# Patient Record
Sex: Female | Born: 1975
Health system: Southern US, Community
[De-identification: ages and names within clinical notes are randomized; demographics above are authoritative.]

## PROBLEM LIST (undated history)

## (undated) DIAGNOSIS — I10 Essential (primary) hypertension: Secondary | ICD-10-CM

## (undated) DIAGNOSIS — E785 Hyperlipidemia, unspecified: Secondary | ICD-10-CM

## (undated) DIAGNOSIS — C801 Malignant (primary) neoplasm, unspecified: Secondary | ICD-10-CM

## (undated) HISTORY — DX: Malignant (primary) neoplasm, unspecified: C80.1

## (undated) HISTORY — DX: Hyperlipidemia, unspecified: E78.5

## (undated) HISTORY — DX: Essential (primary) hypertension: I10

---

## 2002-05-06 ENCOUNTER — Other Ambulatory Visit: Admission: RE | Admit: 2002-05-06 | Discharge: 2002-05-06 | Payer: Self-pay | Admitting: Obstetrics and Gynecology

## 2003-06-02 ENCOUNTER — Other Ambulatory Visit: Admission: RE | Admit: 2003-06-02 | Discharge: 2003-06-02 | Payer: Self-pay | Admitting: Obstetrics and Gynecology

## 2004-07-25 ENCOUNTER — Other Ambulatory Visit: Admission: RE | Admit: 2004-07-25 | Discharge: 2004-07-25 | Payer: Self-pay | Admitting: Obstetrics and Gynecology

## 2004-08-17 ENCOUNTER — Ambulatory Visit (HOSPITAL_COMMUNITY): Admission: RE | Admit: 2004-08-17 | Discharge: 2004-08-17 | Payer: Self-pay | Admitting: Obstetrics and Gynecology

## 2008-09-23 ENCOUNTER — Ambulatory Visit (HOSPITAL_COMMUNITY): Admission: RE | Admit: 2008-09-23 | Discharge: 2008-09-23 | Payer: Self-pay | Admitting: Gynecology

## 2010-05-31 IMAGING — RF DG HYSTEROGRAM
11 series · 11 of 11 positions shown · non-contrast
Comparison: none

CLINICAL DATA: Infertility.  Evaluate tubal patency

HYSTEROSALPINGOGRAM
TECHNIQUE: Hysterosalpingogram was performed by the ordering
physician under fluoroscopy.  Fluoroscopic images are submitted for
interpretation following the procedure.

[Series 1: run · 1 of 1 slices shown (1 of 11)]
[im 1/1]
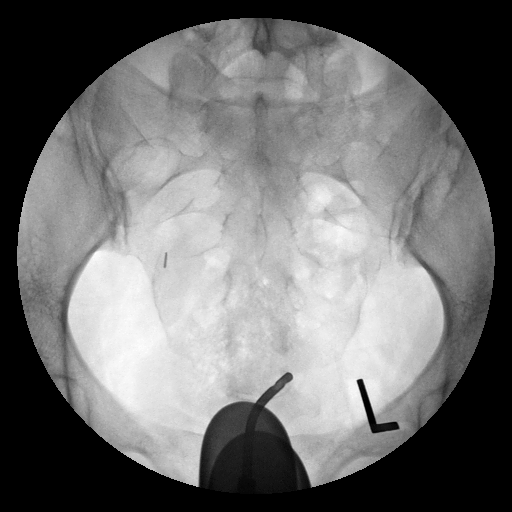

[Series 2: run · 1 of 1 slices shown (2 of 11)]
[im 1/1]
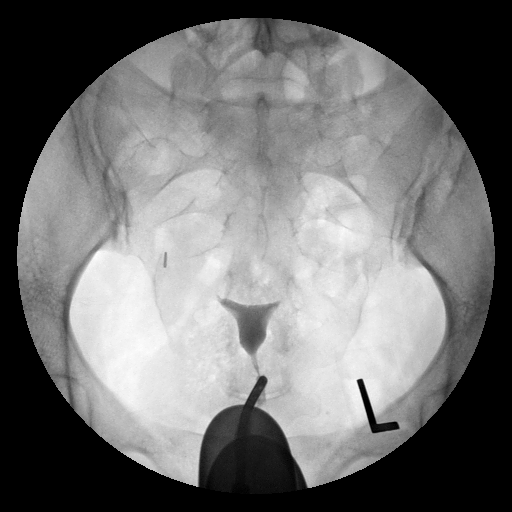

[Series 3: run · 1 of 1 slices shown (3 of 11)]
[im 1/1]
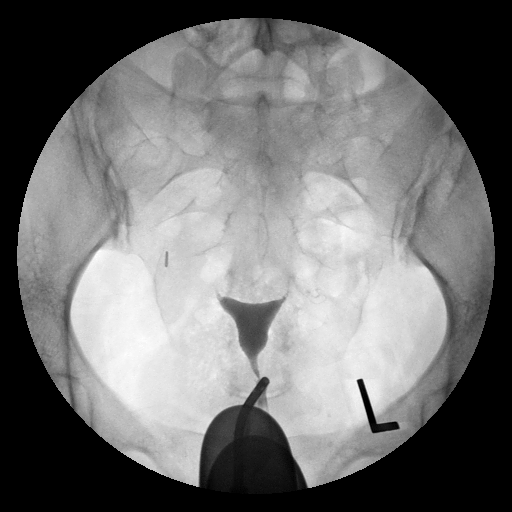

[Series 4: run · 1 of 1 slices shown (4 of 11)]
[im 1/1]
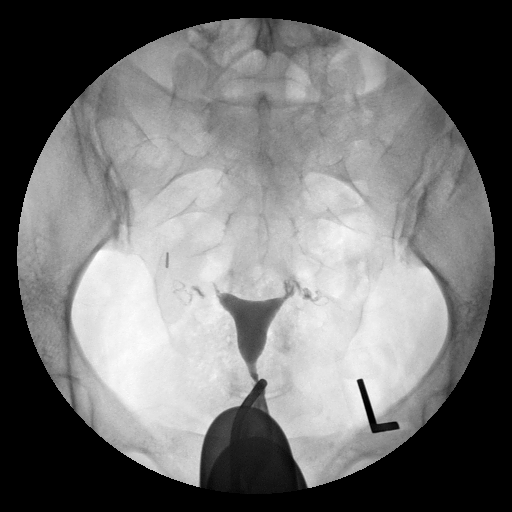

[Series 5: run · 1 of 1 slices shown (5 of 11)]
[im 1/1]
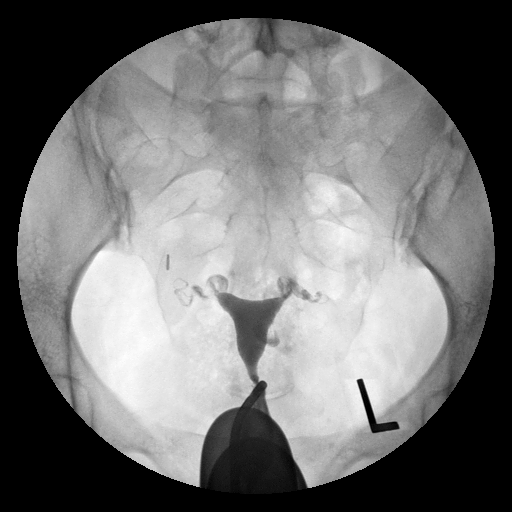

[Series 6: run · 1 of 1 slices shown (6 of 11)]
[im 1/1]
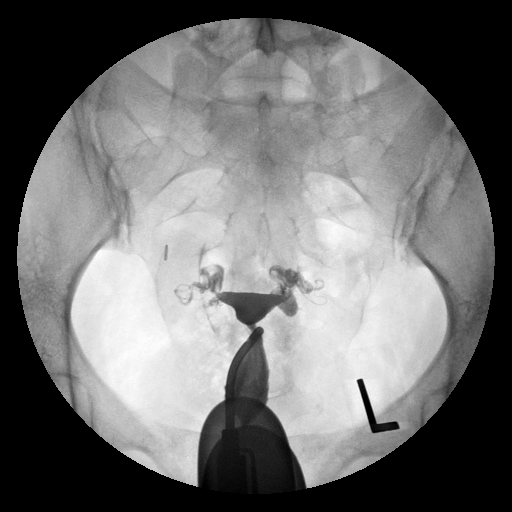

[Series 7: run · 1 of 1 slices shown (7 of 11)]
[im 1/1]
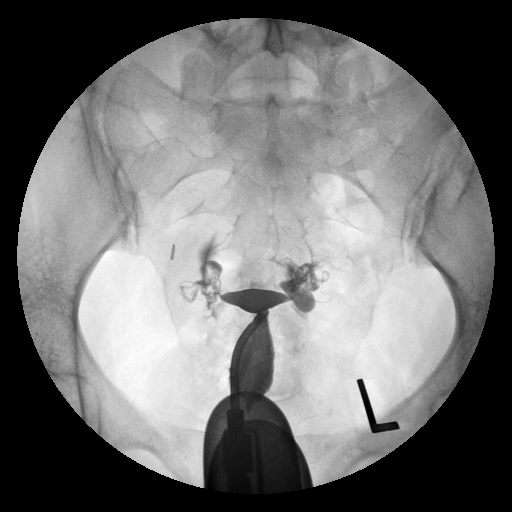

[Series 8: run · 1 of 1 slices shown (8 of 11)]
[im 1/1]
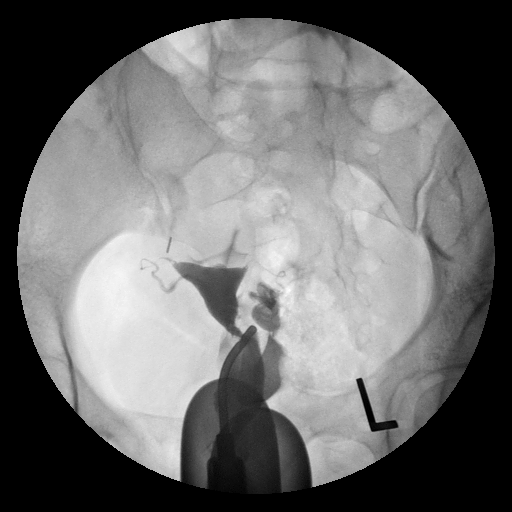

[Series 9: run · 1 of 1 slices shown (9 of 11)]
[im 1/1]
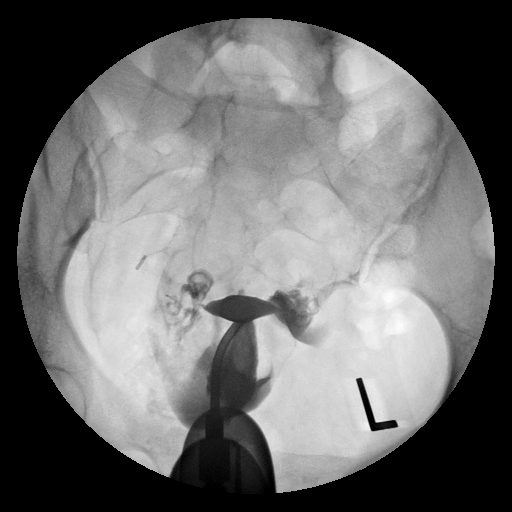

[Series 10: run · 1 of 1 slices shown (10 of 11)]
[im 1/1]
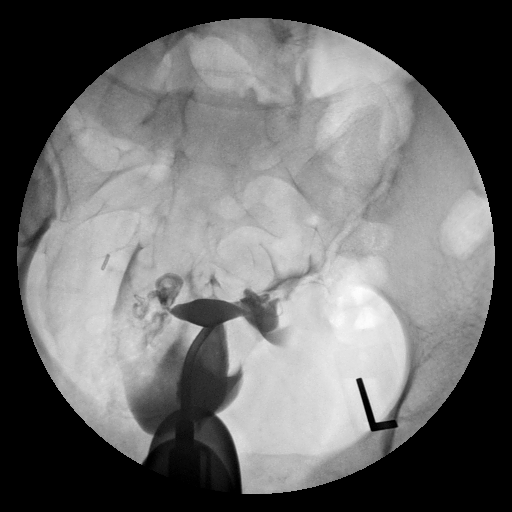

[Series 11: run · 1 of 1 slices shown (11 of 11)]
[im 1/1]
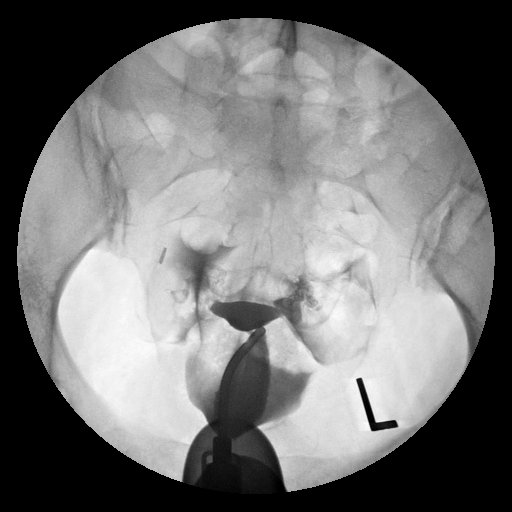

[11 of 11 positions shown; findings below may reference images not displayed]

FINDINGS: A normal endometrial morphology is seen.  The right
fallopian tube demonstrates a normal morphology and free
intraperitoneal spill is noted from this side.

The left fallopian tube is notable for a somewhat bulbous
appearance of the distal aspect of the ampullary portion of the
tube, however free intraperitoneal spill is noted on this side
making this finding of questionable significance.  This could be
the result of some incompletely obstructive adhesions in the
ampullary region.
IMPRESSION: Bilateral tubal patency confirmed with free spill noted.  Normal
endometrial morphology and right fallopian tube morphology.
Somewhat bulbous appearance of the of the distal aspect of the
ampullary portion of the left fallopian tube which is of
questionable significance.  If there was  delayed spill on this
side at real time exam, this may be the result of some incompletely
obstructing adhesions in this location.

10 ml of Omnipaque 300% was administered via Arianna cannula with a
fluoroscopic time of 2.4 minutes

## 2010-06-28 ENCOUNTER — Other Ambulatory Visit (HOSPITAL_COMMUNITY): Payer: Self-pay

## 2010-07-20 ENCOUNTER — Other Ambulatory Visit: Payer: Self-pay | Admitting: Obstetrics and Gynecology

## 2010-07-20 ENCOUNTER — Encounter (HOSPITAL_COMMUNITY): Payer: 59

## 2010-07-20 LAB — BASIC METABOLIC PANEL
BUN: 13 mg/dL (ref 6–23)
Calcium: 9.7 mg/dL (ref 8.4–10.5)
Chloride: 102 mEq/L (ref 96–112)
GFR calc Af Amer: >60 mL/min (ref >60)
Glucose, Bld: 98 mg/dL (ref 70–99)
Sodium: 137 mEq/L (ref 135–145)

## 2010-07-20 LAB — CBC
Platelets: 256 10*3/uL (ref 150–400)
RDW: 13.1 % (ref 11.5–15.5)
WBC: 7.7 10*3/uL (ref 4.0–10.5)

## 2010-07-27 ENCOUNTER — Other Ambulatory Visit: Payer: Self-pay | Admitting: Obstetrics and Gynecology

## 2010-07-27 ENCOUNTER — Ambulatory Visit (HOSPITAL_COMMUNITY)
Admission: RE | Admit: 2010-07-27 | Discharge: 2010-07-27 | Disposition: A | Discharge status: Home / Self Care | Payer: 59 | Source: Ambulatory Visit | Attending: Obstetrics and Gynecology | Admitting: Obstetrics and Gynecology

## 2010-07-27 DIAGNOSIS — Z01818 Encounter for other preprocedural examination: Secondary | ICD-10-CM | POA: Insufficient documentation

## 2010-07-27 DIAGNOSIS — N83209 Unspecified ovarian cyst, unspecified side: Secondary | ICD-10-CM | POA: Insufficient documentation

## 2010-07-27 DIAGNOSIS — Z01812 Encounter for preprocedural laboratory examination: Secondary | ICD-10-CM | POA: Insufficient documentation

## 2010-07-28 NOTE — Op Note (Signed)
Breanna Gamble, Breanna Gamble               ACCOUNT NO.:  [id number]  MEDICAL RECORD NO.:  [id number]           PATIENT TYPE:  O  LOCATION:  [facility]                          [facility]  PHYSICIAN:  Lynore Coscia L. Jayleene Glaeser, M.D.DATE OF BIRTH:  January 30, 1976  DATE OF PROCEDURE:  07/27/2010 DATE OF DISCHARGE:                              OPERATIVE REPORT   PREOPERATIVE DIAGNOSIS:  Left ovarian cyst.  POSTOPERATIVE DIAGNOSES:  Left ovarian dermoid and simple right ovarian cyst.  PROCEDURE:  Diagnostic laparoscopy, left ovarian cystectomy, and drainage of right ovarian cyst and placement of Interceed.  SURGEON:  Journi Moffa L. Vincente Poli, MD  ANESTHESIA:  General.  ESTIMATED BLOOD LOSS:  Minimal.  DRAINS:  None.  PATHOLOGY:  Left ovarian cyst wall.  PROCEDURE:  The patient was taken to the operating room.  She was intubated.  She was then prepped and draped in usual sterile fashion. In-and-out catheter was used to empty the bladder. Uterine manipulator was inserted.  Attention was turned to the abdomen where small infraumbilical incision was made.  The Veress needle was inserted and pneumoperitoneum was performed.  The Veress needle was removed and 11-mm trocar was inserted.  The laparoscope was introduced through the trocar sheath.  She had some adhesions above the incisions involving the omentum to the anterior abdominal wall probably from previous nephrectomy surgery.  There were no adhesions in the lower abdomen or pelvis.  The patient was placed in Trendelenburg position and a 5-mm port was placed suprapubically under direct visualization.  Exam of the abdomen and pelvis revealed the following.  Her abdomen and pelvis appeared normal.  There were no endometriotic implants.  No adhesions below the umbilicus.  There were no other abnormalities.  Her uterus was normal size and anteverted, normal shape.  The cul-de-sac was cleared. Her left ovary and her right ovary were both enlarged.  The  fallopian tubes were normal configuration and appearance.  Her left ovary was enlarged and I could see a cyst within the left ovary.  There was a corpus luteum cyst on the right ovary and there was also a smaller cyst below that.  I did open that up using scissors and it appeared to be consistent with a hemorrhagic cyst.  We drained that and irrigated the cyst out.  Attention was then turned to the left ovary.  Using scissors with electrocautery, I made a linear incision over the cyst.  We then noticed some material which appears sebaceous in amount and we copiously irrigated that.  We then identified the cyst wall and appeared to be consistent with a dermoid.  Using scissors, sharp and blunt dissection, we were able to remove a portion of the ovary with the remainder of at least two thirds of the ovary and remove the cyst wall.  There was a nodule within the cyst wall.  All this was sent to pathology.  We then irrigated copiously the ovarian bed and surgical bed and it was hemostatic.  I did use the Bovie to cauterize this area for further hemostasis and also to minimize the chance of dermoid re-formation in the future.  The patient  did want to retain her ovary because of future fertility desires.  I then inspected the right ovary again.  It was completely hemostatic.  I then placed Interceed over both ovaries to minimize adhesion formation because this patient is infertility patient. The patient tolerated procedure well.  The CO2 was released.  The trocars were removed.  The infraumbilical site was closed with 0 Vicryl and the skin was closed with Dermabond at both sites.  Local had been infiltrated.  All sponge, lap, and instrument counts were correct x2. The patient tolerated the procedure well and went to recovery room in stable condition.     Dewey Viens L. Vincente Poli, M.D.     Florestine Avers  D:  07/27/2010  T:  07/27/2010  Job:  [id number]  Electronically Signed by Marcelle Overlie M.D.  on 07/28/2010 07:19:59 AM

## 2012-06-02 ENCOUNTER — Other Ambulatory Visit: Payer: Self-pay | Admitting: Obstetrics and Gynecology

## 2013-06-08 ENCOUNTER — Other Ambulatory Visit: Payer: Self-pay | Admitting: Obstetrics and Gynecology

## 2014-06-17 ENCOUNTER — Other Ambulatory Visit: Payer: Self-pay | Admitting: Obstetrics and Gynecology

## 2014-06-18 LAB — CYTOLOGY - PAP

## 2015-06-30 DIAGNOSIS — Z0100 Encounter for examination of eyes and vision without abnormal findings: Secondary | ICD-10-CM | POA: Diagnosis not present

## 2015-06-30 DIAGNOSIS — H47331 Pseudopapilledema of optic disc, right eye: Secondary | ICD-10-CM | POA: Diagnosis not present

## 2015-07-04 DIAGNOSIS — C649 Malignant neoplasm of unspecified kidney, except renal pelvis: Secondary | ICD-10-CM | POA: Diagnosis not present

## 2015-07-04 DIAGNOSIS — Z6830 Body mass index (BMI) 30.0-30.9, adult: Secondary | ICD-10-CM | POA: Diagnosis not present

## 2015-07-04 DIAGNOSIS — Z01419 Encounter for gynecological examination (general) (routine) without abnormal findings: Secondary | ICD-10-CM | POA: Diagnosis not present

## 2015-07-04 DIAGNOSIS — E785 Hyperlipidemia, unspecified: Secondary | ICD-10-CM | POA: Diagnosis not present

## 2015-07-04 DIAGNOSIS — Z13228 Encounter for screening for other metabolic disorders: Secondary | ICD-10-CM | POA: Diagnosis not present

## 2015-07-04 DIAGNOSIS — Z131 Encounter for screening for diabetes mellitus: Secondary | ICD-10-CM | POA: Diagnosis not present

## 2015-10-30 DIAGNOSIS — H1030 Unspecified acute conjunctivitis, unspecified eye: Secondary | ICD-10-CM | POA: Diagnosis not present

## 2016-03-21 DIAGNOSIS — M1712 Unilateral primary osteoarthritis, left knee: Secondary | ICD-10-CM | POA: Diagnosis not present

## 2016-03-21 DIAGNOSIS — M25562 Pain in left knee: Secondary | ICD-10-CM | POA: Diagnosis not present

## 2016-04-16 DIAGNOSIS — M1712 Unilateral primary osteoarthritis, left knee: Secondary | ICD-10-CM | POA: Diagnosis not present

## 2016-06-22 DIAGNOSIS — M1712 Unilateral primary osteoarthritis, left knee: Secondary | ICD-10-CM | POA: Diagnosis not present

## 2016-06-27 DIAGNOSIS — M25562 Pain in left knee: Secondary | ICD-10-CM | POA: Diagnosis not present

## 2016-06-28 DIAGNOSIS — M1712 Unilateral primary osteoarthritis, left knee: Secondary | ICD-10-CM | POA: Diagnosis not present

## 2016-06-28 DIAGNOSIS — M7632 Iliotibial band syndrome, left leg: Secondary | ICD-10-CM | POA: Diagnosis not present

## 2016-08-03 DIAGNOSIS — M4135 Thoracogenic scoliosis, thoracolumbar region: Secondary | ICD-10-CM | POA: Diagnosis not present

## 2016-08-03 DIAGNOSIS — M7632 Iliotibial band syndrome, left leg: Secondary | ICD-10-CM | POA: Diagnosis not present

## 2016-08-03 DIAGNOSIS — T7840XA Allergy, unspecified, initial encounter: Secondary | ICD-10-CM | POA: Diagnosis not present

## 2016-08-10 DIAGNOSIS — I10 Essential (primary) hypertension: Secondary | ICD-10-CM | POA: Diagnosis not present

## 2016-08-10 DIAGNOSIS — Z6830 Body mass index (BMI) 30.0-30.9, adult: Secondary | ICD-10-CM | POA: Diagnosis not present

## 2016-08-10 DIAGNOSIS — E782 Mixed hyperlipidemia: Secondary | ICD-10-CM | POA: Diagnosis not present

## 2016-08-10 DIAGNOSIS — R739 Hyperglycemia, unspecified: Secondary | ICD-10-CM | POA: Diagnosis not present

## 2016-08-10 DIAGNOSIS — Z23 Encounter for immunization: Secondary | ICD-10-CM | POA: Diagnosis not present

## 2016-09-12 DIAGNOSIS — Z0000 Encounter for general adult medical examination without abnormal findings: Secondary | ICD-10-CM | POA: Diagnosis not present

## 2016-10-09 DIAGNOSIS — R739 Hyperglycemia, unspecified: Secondary | ICD-10-CM | POA: Diagnosis not present

## 2016-10-09 DIAGNOSIS — Z13220 Encounter for screening for lipoid disorders: Secondary | ICD-10-CM | POA: Diagnosis not present

## 2016-10-09 DIAGNOSIS — I10 Essential (primary) hypertension: Secondary | ICD-10-CM | POA: Diagnosis not present

## 2016-10-09 DIAGNOSIS — E669 Obesity, unspecified: Secondary | ICD-10-CM | POA: Diagnosis not present

## 2016-10-09 DIAGNOSIS — Z1231 Encounter for screening mammogram for malignant neoplasm of breast: Secondary | ICD-10-CM | POA: Diagnosis not present

## 2016-10-11 DIAGNOSIS — E282 Polycystic ovarian syndrome: Secondary | ICD-10-CM | POA: Diagnosis not present

## 2016-10-11 DIAGNOSIS — I10 Essential (primary) hypertension: Secondary | ICD-10-CM | POA: Diagnosis not present

## 2016-10-11 DIAGNOSIS — Z6831 Body mass index (BMI) 31.0-31.9, adult: Secondary | ICD-10-CM | POA: Diagnosis not present

## 2016-10-11 DIAGNOSIS — E782 Mixed hyperlipidemia: Secondary | ICD-10-CM | POA: Diagnosis not present

## 2016-11-20 DIAGNOSIS — J205 Acute bronchitis due to respiratory syncytial virus: Secondary | ICD-10-CM | POA: Diagnosis not present

## 2016-11-27 DIAGNOSIS — R05 Cough: Secondary | ICD-10-CM | POA: Diagnosis not present

## 2016-12-11 DIAGNOSIS — R0602 Shortness of breath: Secondary | ICD-10-CM | POA: Diagnosis not present

## 2016-12-11 DIAGNOSIS — J209 Acute bronchitis, unspecified: Secondary | ICD-10-CM | POA: Diagnosis not present

## 2016-12-20 DIAGNOSIS — R079 Chest pain, unspecified: Secondary | ICD-10-CM | POA: Diagnosis not present

## 2016-12-20 DIAGNOSIS — R0602 Shortness of breath: Secondary | ICD-10-CM | POA: Diagnosis not present

## 2016-12-20 DIAGNOSIS — R0789 Other chest pain: Secondary | ICD-10-CM | POA: Diagnosis not present

## 2017-03-29 DIAGNOSIS — J069 Acute upper respiratory infection, unspecified: Secondary | ICD-10-CM | POA: Diagnosis not present

## 2017-03-29 DIAGNOSIS — J029 Acute pharyngitis, unspecified: Secondary | ICD-10-CM | POA: Diagnosis not present

## 2017-04-18 DIAGNOSIS — Z6831 Body mass index (BMI) 31.0-31.9, adult: Secondary | ICD-10-CM | POA: Diagnosis not present

## 2017-04-18 DIAGNOSIS — Z01419 Encounter for gynecological examination (general) (routine) without abnormal findings: Secondary | ICD-10-CM | POA: Diagnosis not present

## 2017-04-18 DIAGNOSIS — F419 Anxiety disorder, unspecified: Secondary | ICD-10-CM | POA: Diagnosis not present

## 2017-06-26 DIAGNOSIS — F410 Panic disorder [episodic paroxysmal anxiety] without agoraphobia: Secondary | ICD-10-CM | POA: Diagnosis not present

## 2017-06-26 DIAGNOSIS — R7309 Other abnormal glucose: Secondary | ICD-10-CM | POA: Diagnosis not present

## 2017-06-26 DIAGNOSIS — E282 Polycystic ovarian syndrome: Secondary | ICD-10-CM | POA: Diagnosis not present

## 2017-06-26 DIAGNOSIS — I10 Essential (primary) hypertension: Secondary | ICD-10-CM | POA: Diagnosis not present

## 2017-06-26 DIAGNOSIS — Z23 Encounter for immunization: Secondary | ICD-10-CM | POA: Diagnosis not present

## 2017-11-19 DIAGNOSIS — E782 Mixed hyperlipidemia: Secondary | ICD-10-CM | POA: Diagnosis not present

## 2017-11-19 DIAGNOSIS — Z6830 Body mass index (BMI) 30.0-30.9, adult: Secondary | ICD-10-CM | POA: Diagnosis not present

## 2017-11-19 DIAGNOSIS — E119 Type 2 diabetes mellitus without complications: Secondary | ICD-10-CM | POA: Diagnosis not present

## 2017-11-19 DIAGNOSIS — I10 Essential (primary) hypertension: Secondary | ICD-10-CM | POA: Diagnosis not present

## 2018-03-27 DIAGNOSIS — E782 Mixed hyperlipidemia: Secondary | ICD-10-CM | POA: Diagnosis not present

## 2018-03-27 DIAGNOSIS — I10 Essential (primary) hypertension: Secondary | ICD-10-CM | POA: Diagnosis not present

## 2018-03-27 DIAGNOSIS — E119 Type 2 diabetes mellitus without complications: Secondary | ICD-10-CM | POA: Diagnosis not present

## 2018-03-31 DIAGNOSIS — I10 Essential (primary) hypertension: Secondary | ICD-10-CM | POA: Diagnosis not present

## 2018-03-31 DIAGNOSIS — E782 Mixed hyperlipidemia: Secondary | ICD-10-CM | POA: Diagnosis not present

## 2018-03-31 DIAGNOSIS — Z6831 Body mass index (BMI) 31.0-31.9, adult: Secondary | ICD-10-CM | POA: Diagnosis not present

## 2018-03-31 DIAGNOSIS — E119 Type 2 diabetes mellitus without complications: Secondary | ICD-10-CM | POA: Diagnosis not present

## 2018-04-15 DIAGNOSIS — Z0000 Encounter for general adult medical examination without abnormal findings: Secondary | ICD-10-CM | POA: Diagnosis not present

## 2018-04-15 DIAGNOSIS — Z1329 Encounter for screening for other suspected endocrine disorder: Secondary | ICD-10-CM | POA: Diagnosis not present

## 2018-04-15 DIAGNOSIS — Z13220 Encounter for screening for lipoid disorders: Secondary | ICD-10-CM | POA: Diagnosis not present

## 2018-04-15 DIAGNOSIS — Z114 Encounter for screening for human immunodeficiency virus [HIV]: Secondary | ICD-10-CM | POA: Diagnosis not present

## 2018-04-17 DIAGNOSIS — Z0000 Encounter for general adult medical examination without abnormal findings: Secondary | ICD-10-CM | POA: Diagnosis not present

## 2018-04-17 DIAGNOSIS — Z6831 Body mass index (BMI) 31.0-31.9, adult: Secondary | ICD-10-CM | POA: Diagnosis not present

## 2018-04-21 ENCOUNTER — Encounter: Payer: BLUE CROSS/BLUE SHIELD | Attending: Family Medicine | Admitting: Registered"

## 2018-04-21 ENCOUNTER — Encounter: Payer: Self-pay | Admitting: Registered"

## 2018-04-21 DIAGNOSIS — E119 Type 2 diabetes mellitus without complications: Secondary | ICD-10-CM | POA: Diagnosis not present

## 2018-04-21 NOTE — Patient Instructions (Addendum)
Nutrition Goals/Instructions: Make sure to get in three meals per day. Try to have balanced meals like the Plate example (see handout). Include lean proteins, vegetables, fruits, and whole grains at meals.   Have 3 meals per day and include 2-3 carbohydrate choices (30-45 g) per meal. Try to eat every 3-5 hours.   Breakfast: Include protein and carbohydrates (see handout). Ideas discussed: boiled egg(s), whole wheat toast and fruit, low fat milk; Greek yogurt and fruit; natural peanut butter on whole wheat bread with low fat milk and may add fruit, etc (see handout).   Include heart healthy unsaturated fats in place of saturated fats. Look for foods with around 5% or less for sodium and saturated fat most of the time and limit those with 20% or more DV.   Recommend including at least 64 oz water daily (4 x 16 oz bottle of water).  Calorieking.[email or web address] has nutrition information for restaurants   Additional Goals/Instructions:  Continue checking blood sugar-recommend checking fasting and once 2 hours after a meal. Recommend tracking numbers and may be helpful to track meals in the beginning as well to see effects on blood sugar.    Make physical activity a part of your week. Try to include at least 30 minutes of physical activity 5 days each week or at least 150 minutes per week. Regular physical activity promotes overall health-including helping to reduce risk for heart disease and diabetes, promoting mental health, and helping [place] sleep better.

## 2018-04-21 NOTE — Progress Notes (Signed)
Diabetes Self-Management Education  Visit Type: First/Initial  Appt. Start Time: 0801 Appt. End Time: 0940  04/21/2018  [name], identified by name and date of birth, is a 43 y.o. female with a diagnosis of Diabetes: Type 2.   ASSESSMENT  There were no vitals taken for this visit. There is no height or weight on file to calculate BMI.   Pt present for appointment alone. Pt referred due to T2DM, weight management, and HTN. Pt has also has PMH of hyperlipidemia and Wilms tumor as a child which resulted in removal of right kidney. Pt reports that she has PCOS and that her blood sugar has been in the prediabetes range for several years. Pt reports that she has been taking Metformin for around 8 years but her dosage was increased to twice daily when she was recently told she has been dx with diabetes. Pt has a glucometer and has been SMBG 1-2 times daily, fasting in the morning and at a random time later in the day. She reports she is not sure how often she should be testing. Pt reports her blood sugar is usually between 103-112. She reports her lowest reading was 66 and highest was 175 about 1 hour after meal of pasta. Pt reports feeling a little dizzy when her blood sugar was 66 and that her glucose monitor told her to consume 15 g carbohydrates so she ate a cookie. Pt reports that in the past during the time her triglycerides were significantly higher she was eating fast food very regularly, but has since cut back on how often she eats fast food.   Pt reports that she needs guidance on what she should eat. She reports that she tried Weight Watchers multiple times in the past but it was not structured enough and included too many options. Pt reports that she would like to lose weight and hearing about her BMI was overwhelming for her. Pt reports that she feels like the number listed as a healthy BMI for her would not be healthy but too low. Pt reports that she is a picky eater. Reports that  she needs to make changes with her habits to include more vegetables. Pt would like to know if she should include a multivitamin since she does not get in enough fruits and vegetables. Pt reports that she did not like yogurt the one time she tried it but is willing to try [place] yogurt. Pt likes milk and cheese. Pt reports she also struggles with drinking enough water. She reports that she often does not think about drinking even if water bottle is on her desk. Pt reports that she plans to start working with a trainer at a gym.   Pertinent Lab Values: 04/15/18: Triglycerides: 164 03/27/18: Triglycerides: 190 HgbA1c: 6.2%   Diabetes Self-Management Education - 04/21/18 0819      Visit Information   Visit Type  First/Initial      Initial Visit   Diabetes Type  Type 2    Are you currently following a meal plan?  No    Are you taking your medications as prescribed?  Yes    Date Diagnosed  2020      Health Coping   How would you rate your overall health?  Fair      Psychosocial Assessment   Patient Belief/Attitude about Diabetes  Motivated to manage diabetes    Self-care barriers  Unable to determine    Self-management support  Doctor's office    Other persons  present  Patient    Patient Concerns  Weight Control;Nutrition/Meal planning    Special Needs  None;Unable to determine    Preferred Learning Style  No preference indicated    Learning Readiness  Ready    How often do you need to have someone help you when you read instructions, pamphlets, or other written materials from your doctor or pharmacy?  1 - Never    What is the last grade level you completed in school?  Some college.       Pre-Education Assessment   Patient understands the diabetes disease and treatment process.  Needs Instruction    Patient understands incorporating nutritional management into lifestyle.  Needs Instruction    Patient undertands incorporating physical activity into lifestyle.  Needs Instruction     Patient understands using medications safely.  Demonstrates understanding / competency    Patient understands monitoring blood glucose, interpreting and using results  Needs Instruction    Patient understands prevention, detection, and treatment of acute complications.  Needs Instruction    Patient understands prevention, detection, and treatment of chronic complications.  Needs Instruction    Patient understands how to develop strategies to address psychosocial issues.  Needs Instruction    Patient understands how to develop strategies to promote health/change behavior.  Needs Instruction      Complications   Last HgB A1C per patient/outside source  6.2 %    How often do you check your blood sugar?  1-2 times/day    Fasting Blood glucose range (mg/dL)  70-129    Postprandial Blood glucose range (mg/dL)  70-129;130-179    Number of hypoglycemic episodes per month  1    Can you tell when your blood sugar is low?  Yes    What do you do if your blood sugar is low?  Ate a cookie.     Number of hyperglycemic episodes per week  0    Have you had a dilated eye exam in the past 12 months?  Yes    Have you had a dental exam in the past 12 months?  Yes    Are you checking your feet?  No      Dietary Intake   Breakfast  (8:30 AM) Egg Mcmuffin, 1 cup coffee      Snack (morning)  None reported.     Lunch  (1:30 PM) Chicken quesadilla, french fries    Snack (afternoon)  coffee, 2 tbsp sugar free creamer     Dinner  taco bake (pasta, hamburger meat, cheese)    Snack (evening)  None reported.     Beverage(s)  2 cups coffee with sugar free creamer, 16 oz plain water, ~8 oz sparkling water      Exercise   Exercise Type  ADL's    How many days per week to you exercise?  0    How many minutes per day do you exercise?  0    Total minutes per week of exercise  0      Patient Education   Previous Diabetes Education  No    Disease state   Definition of diabetes, type 1 and 2, and the diagnosis of  diabetes;Factors that contribute to the development of diabetes    Nutrition management   Role of diet in the treatment of diabetes and the relationship between the three main macronutrients and blood glucose level;Food label reading, portion sizes and measuring food.;Effects of alcohol on blood glucose and safety factors with consumption of alcohol.;Carbohydrate  counting    Physical activity and exercise   Role of exercise on diabetes management, blood pressure control and cardiac health.    Medications  Reviewed patients medication for diabetes, action, purpose, timing of dose and side effects.    Monitoring  Purpose and frequency of SMBG.;Interpreting lab values - A1C, lipid, urine microalbumina.;Yearly dilated eye exam;Daily foot exams    Acute complications  Taught treatment of hypoglycemia - the 15 rule.;Covered sick day management with medication and food.;Discussed and identified patients' treatment of hyperglycemia.    Chronic complications  Relationship between chronic complications and blood glucose control;Lipid levels, blood glucose control and heart disease;Assessed and discussed foot care and prevention of foot problems;Retinopathy and reason for yearly dilated eye exams    Psychosocial adjustment  Role of stress on diabetes      Individualized Goals (developed by patient)   Nutrition  Follow meal plan discussed;General guidelines for healthy choices and portions discussed    Physical Activity  Exercise 3-5 times per week;30 minutes per day    Medications  take my medication as prescribed    Monitoring   test my blood glucose as discussed    Reducing Risk  do foot checks daily;treat hypoglycemia with 15 grams of carbs if blood glucose less than 70mg /dL      Post-Education Assessment   Patient understands the diabetes disease and treatment process.  Demonstrates understanding / competency    Patient understands incorporating nutritional management into lifestyle.  Demonstrates  understanding / competency    Patient undertands incorporating physical activity into lifestyle.  Demonstrates understanding / competency    Patient understands using medications safely.  Demonstrates understanding / competency    Patient understands monitoring blood glucose, interpreting and using results  Demonstrates understanding / competency    Patient understands prevention, detection, and treatment of acute complications.  Demonstrates understanding / competency    Patient understands prevention, detection, and treatment of chronic complications.  Demonstrates understanding / competency    Patient understands how to develop strategies to address psychosocial issues.  Demonstrates understanding / competency    Patient understands how to develop strategies to promote health/change behavior.  Demonstrates understanding / competency      Outcomes   Expected Outcomes  Demonstrated interest in learning. Expect positive outcomes    Future DMSE  PRN    Program Status  Completed       Individualized Plan for Diabetes Self-Management Training:   Learning Objective:  Patient will have a greater understanding of diabetes self-management. Patient education plan is to attend individual and/or group sessions per assessed needs and concerns.   Plan: Dietitian provided DSME and heart healthy nutrition therapy. Dietitian recommended pt take a multivitamin which includes 100% DV for vitamin C if she is not receiving recommended amount of fruits and vegetables as pt reports concerns. Discussed that whole foods are preferred over vitamins, however. Discussed ways to get in more vegetables with meals/ balanced meal planning. Recommended pt take blood sugar twice daily-fasting and 2 hours after start of one meal later in the day and record these numbers. Discussed that tracking dietary intake at the beginning may also be helpful to see effects of diet on blood sugar. Discussed importance of focusing on healthy  habits rather than focusing on numbers (weight). Pt appeared agreeable to information/goals discussed.   Patient Instructions  Nutrition Goals/Instructions: Make sure to get in three meals per day. Try to have balanced meals like the Plate example (see handout). Include lean proteins, vegetables,  fruits, and whole grains at meals.   Have 3 meals per day and include 2-3 carbohydrate choices (30-45 g) per meal. Try to eat every 3-5 hours.   Breakfast: Include protein and carbohydrates (see handout). Ideas discussed: boiled egg(s), whole wheat toast and fruit, low fat milk; Greek yogurt and fruit; natural peanut butter on whole wheat bread with low fat milk and may add fruit, etc (see handout).   Include heart healthy unsaturated fats in place of saturated fats. Look for foods with around 5% or less for sodium and saturated fat most of the time and limit those with 20% or more DV.   Recommend including at least 64 oz water daily (4 x 16 oz bottle of water).  [email or web address] has nutrition information for restaurants   Additional Goals/Instructions:  Continue checking blood sugar-recommend checking fasting and once 2 hours after a meal. Recommend tracking numbers and may be helpful to track meals in the beginning as well to see effects on blood sugar.    Make physical activity a part of your week. Try to include at least 30 minutes of physical activity 5 days each week or at least 150 minutes per week. Regular physical activity promotes overall health-including helping to reduce risk for heart disease and diabetes, promoting mental health, and helping [place] sleep better.     Expected Outcomes:  Demonstrated interest in learning. Expect positive outcomes  Education material provided: ADA Diabetes: Your Take Control Guide, My Plate, Snack sheet, Support group flyer and Carbohydrate counting sheet  If problems or questions, patient to contact team via:  Phone and Email  Future DSME appointment:  PRN Contact information provided.

## 2018-05-30 DIAGNOSIS — F411 Generalized anxiety disorder: Secondary | ICD-10-CM | POA: Diagnosis not present

## 2018-05-30 DIAGNOSIS — I10 Essential (primary) hypertension: Secondary | ICD-10-CM | POA: Diagnosis not present

## 2018-05-30 DIAGNOSIS — Z719 Counseling, unspecified: Secondary | ICD-10-CM | POA: Diagnosis not present

## 2018-05-30 DIAGNOSIS — R000 Tachycardia, unspecified: Secondary | ICD-10-CM | POA: Diagnosis not present

## 2018-06-03 DIAGNOSIS — I10 Essential (primary) hypertension: Secondary | ICD-10-CM | POA: Diagnosis not present

## 2018-06-03 DIAGNOSIS — Z719 Counseling, unspecified: Secondary | ICD-10-CM | POA: Diagnosis not present

## 2018-06-03 DIAGNOSIS — E119 Type 2 diabetes mellitus without complications: Secondary | ICD-10-CM | POA: Diagnosis not present

## 2018-06-03 DIAGNOSIS — F411 Generalized anxiety disorder: Secondary | ICD-10-CM | POA: Diagnosis not present

## 2018-06-24 DIAGNOSIS — F411 Generalized anxiety disorder: Secondary | ICD-10-CM | POA: Diagnosis not present

## 2018-06-24 DIAGNOSIS — I10 Essential (primary) hypertension: Secondary | ICD-10-CM | POA: Diagnosis not present

## 2018-06-24 DIAGNOSIS — Z719 Counseling, unspecified: Secondary | ICD-10-CM | POA: Diagnosis not present

## 2018-06-24 DIAGNOSIS — R001 Bradycardia, unspecified: Secondary | ICD-10-CM | POA: Diagnosis not present

## 2018-06-27 DIAGNOSIS — R0789 Other chest pain: Secondary | ICD-10-CM | POA: Diagnosis not present

## 2018-06-27 DIAGNOSIS — E785 Hyperlipidemia, unspecified: Secondary | ICD-10-CM | POA: Diagnosis not present

## 2018-06-27 DIAGNOSIS — I10 Essential (primary) hypertension: Secondary | ICD-10-CM | POA: Diagnosis not present

## 2018-07-03 DIAGNOSIS — R0789 Other chest pain: Secondary | ICD-10-CM | POA: Diagnosis not present

## 2018-07-11 DIAGNOSIS — E119 Type 2 diabetes mellitus without complications: Secondary | ICD-10-CM | POA: Diagnosis not present

## 2018-07-11 DIAGNOSIS — E782 Mixed hyperlipidemia: Secondary | ICD-10-CM | POA: Diagnosis not present

## 2018-07-11 DIAGNOSIS — I10 Essential (primary) hypertension: Secondary | ICD-10-CM | POA: Diagnosis not present

## 2018-07-16 DIAGNOSIS — I10 Essential (primary) hypertension: Secondary | ICD-10-CM | POA: Diagnosis not present

## 2018-07-16 DIAGNOSIS — E119 Type 2 diabetes mellitus without complications: Secondary | ICD-10-CM | POA: Diagnosis not present

## 2018-07-16 DIAGNOSIS — E782 Mixed hyperlipidemia: Secondary | ICD-10-CM | POA: Diagnosis not present

## 2018-07-16 DIAGNOSIS — F411 Generalized anxiety disorder: Secondary | ICD-10-CM | POA: Diagnosis not present

## 2018-10-14 DIAGNOSIS — I10 Essential (primary) hypertension: Secondary | ICD-10-CM | POA: Diagnosis not present

## 2018-10-14 DIAGNOSIS — E782 Mixed hyperlipidemia: Secondary | ICD-10-CM | POA: Diagnosis not present

## 2018-10-14 DIAGNOSIS — E119 Type 2 diabetes mellitus without complications: Secondary | ICD-10-CM | POA: Diagnosis not present

## 2018-10-17 DIAGNOSIS — E782 Mixed hyperlipidemia: Secondary | ICD-10-CM | POA: Diagnosis not present

## 2018-10-17 DIAGNOSIS — I10 Essential (primary) hypertension: Secondary | ICD-10-CM | POA: Diagnosis not present

## 2018-10-17 DIAGNOSIS — E663 Overweight: Secondary | ICD-10-CM | POA: Diagnosis not present

## 2018-10-17 DIAGNOSIS — E119 Type 2 diabetes mellitus without complications: Secondary | ICD-10-CM | POA: Diagnosis not present

## 2019-01-16 DIAGNOSIS — E119 Type 2 diabetes mellitus without complications: Secondary | ICD-10-CM | POA: Diagnosis not present

## 2019-01-16 DIAGNOSIS — I10 Essential (primary) hypertension: Secondary | ICD-10-CM | POA: Diagnosis not present

## 2019-01-16 DIAGNOSIS — E663 Overweight: Secondary | ICD-10-CM | POA: Diagnosis not present

## 2019-06-15 DIAGNOSIS — Z0184 Encounter for antibody response examination: Secondary | ICD-10-CM | POA: Diagnosis not present

## 2019-06-15 DIAGNOSIS — Z20828 Contact with and (suspected) exposure to other viral communicable diseases: Secondary | ICD-10-CM | POA: Diagnosis not present

## 2019-06-15 DIAGNOSIS — E782 Mixed hyperlipidemia: Secondary | ICD-10-CM | POA: Diagnosis not present

## 2019-06-15 DIAGNOSIS — Z0000 Encounter for general adult medical examination without abnormal findings: Secondary | ICD-10-CM | POA: Diagnosis not present

## 2019-06-17 DIAGNOSIS — R739 Hyperglycemia, unspecified: Secondary | ICD-10-CM | POA: Diagnosis not present

## 2019-06-17 DIAGNOSIS — Z6832 Body mass index (BMI) 32.0-32.9, adult: Secondary | ICD-10-CM | POA: Diagnosis not present

## 2019-06-17 DIAGNOSIS — Z0000 Encounter for general adult medical examination without abnormal findings: Secondary | ICD-10-CM | POA: Diagnosis not present

## 2019-06-24 DIAGNOSIS — I10 Essential (primary) hypertension: Secondary | ICD-10-CM | POA: Diagnosis not present

## 2019-06-24 DIAGNOSIS — E119 Type 2 diabetes mellitus without complications: Secondary | ICD-10-CM | POA: Diagnosis not present

## 2019-06-24 DIAGNOSIS — F411 Generalized anxiety disorder: Secondary | ICD-10-CM | POA: Diagnosis not present

## 2019-10-05 DIAGNOSIS — E119 Type 2 diabetes mellitus without complications: Secondary | ICD-10-CM | POA: Diagnosis not present

## 2019-10-05 DIAGNOSIS — I10 Essential (primary) hypertension: Secondary | ICD-10-CM | POA: Diagnosis not present

## 2019-10-05 DIAGNOSIS — R739 Hyperglycemia, unspecified: Secondary | ICD-10-CM | POA: Diagnosis not present

## 2019-10-05 DIAGNOSIS — E782 Mixed hyperlipidemia: Secondary | ICD-10-CM | POA: Diagnosis not present

## 2019-10-09 DIAGNOSIS — E669 Obesity, unspecified: Secondary | ICD-10-CM | POA: Diagnosis not present

## 2019-10-09 DIAGNOSIS — E782 Mixed hyperlipidemia: Secondary | ICD-10-CM | POA: Diagnosis not present

## 2019-10-09 DIAGNOSIS — I10 Essential (primary) hypertension: Secondary | ICD-10-CM | POA: Diagnosis not present

## 2019-10-09 DIAGNOSIS — E1165 Type 2 diabetes mellitus with hyperglycemia: Secondary | ICD-10-CM | POA: Diagnosis not present

## 2019-10-09 DIAGNOSIS — Z2821 Immunization not carried out because of patient refusal: Secondary | ICD-10-CM | POA: Diagnosis not present

## 2019-10-31 DIAGNOSIS — Z20822 Contact with and (suspected) exposure to covid-19: Secondary | ICD-10-CM | POA: Diagnosis not present

## 2020-01-27 DIAGNOSIS — E782 Mixed hyperlipidemia: Secondary | ICD-10-CM | POA: Diagnosis not present

## 2020-01-27 DIAGNOSIS — R739 Hyperglycemia, unspecified: Secondary | ICD-10-CM | POA: Diagnosis not present

## 2020-01-27 DIAGNOSIS — E1165 Type 2 diabetes mellitus with hyperglycemia: Secondary | ICD-10-CM | POA: Diagnosis not present

## 2020-01-27 DIAGNOSIS — E663 Overweight: Secondary | ICD-10-CM | POA: Diagnosis not present

## 2020-02-01 DIAGNOSIS — E1165 Type 2 diabetes mellitus with hyperglycemia: Secondary | ICD-10-CM | POA: Diagnosis not present

## 2020-02-01 DIAGNOSIS — Z7189 Other specified counseling: Secondary | ICD-10-CM | POA: Diagnosis not present

## 2020-02-01 DIAGNOSIS — E782 Mixed hyperlipidemia: Secondary | ICD-10-CM | POA: Diagnosis not present

## 2020-02-01 DIAGNOSIS — F331 Major depressive disorder, recurrent, moderate: Secondary | ICD-10-CM | POA: Diagnosis not present

## 2020-02-01 DIAGNOSIS — F411 Generalized anxiety disorder: Secondary | ICD-10-CM | POA: Diagnosis not present

## 2020-02-04 DIAGNOSIS — E1165 Type 2 diabetes mellitus with hyperglycemia: Secondary | ICD-10-CM | POA: Diagnosis not present

## 2020-02-04 DIAGNOSIS — E669 Obesity, unspecified: Secondary | ICD-10-CM | POA: Diagnosis not present

## 2020-02-04 DIAGNOSIS — E782 Mixed hyperlipidemia: Secondary | ICD-10-CM | POA: Diagnosis not present

## 2020-02-04 DIAGNOSIS — I10 Essential (primary) hypertension: Secondary | ICD-10-CM | POA: Diagnosis not present

## 2020-08-16 DIAGNOSIS — R739 Hyperglycemia, unspecified: Secondary | ICD-10-CM | POA: Diagnosis not present

## 2020-08-16 DIAGNOSIS — Z0000 Encounter for general adult medical examination without abnormal findings: Secondary | ICD-10-CM | POA: Diagnosis not present

## 2020-08-16 DIAGNOSIS — E119 Type 2 diabetes mellitus without complications: Secondary | ICD-10-CM | POA: Diagnosis not present

## 2020-08-16 DIAGNOSIS — E782 Mixed hyperlipidemia: Secondary | ICD-10-CM | POA: Diagnosis not present

## 2020-08-18 ENCOUNTER — Other Ambulatory Visit: Payer: Self-pay | Admitting: Family Medicine

## 2020-08-18 DIAGNOSIS — Z1159 Encounter for screening for other viral diseases: Secondary | ICD-10-CM | POA: Diagnosis not present

## 2020-08-18 DIAGNOSIS — Z118 Encounter for screening for other infectious and parasitic diseases: Secondary | ICD-10-CM | POA: Diagnosis not present

## 2020-08-18 DIAGNOSIS — Z2821 Immunization not carried out because of patient refusal: Secondary | ICD-10-CM | POA: Diagnosis not present

## 2020-08-18 DIAGNOSIS — Z1231 Encounter for screening mammogram for malignant neoplasm of breast: Secondary | ICD-10-CM

## 2020-08-18 DIAGNOSIS — Z0000 Encounter for general adult medical examination without abnormal findings: Secondary | ICD-10-CM | POA: Diagnosis not present

## 2020-08-18 DIAGNOSIS — Z7189 Other specified counseling: Secondary | ICD-10-CM | POA: Diagnosis not present

## 2020-08-18 DIAGNOSIS — R739 Hyperglycemia, unspecified: Secondary | ICD-10-CM | POA: Diagnosis not present

## 2020-08-18 DIAGNOSIS — Z01419 Encounter for gynecological examination (general) (routine) without abnormal findings: Secondary | ICD-10-CM | POA: Diagnosis not present

## 2020-09-01 DIAGNOSIS — E782 Mixed hyperlipidemia: Secondary | ICD-10-CM | POA: Diagnosis not present

## 2020-09-01 DIAGNOSIS — E1165 Type 2 diabetes mellitus with hyperglycemia: Secondary | ICD-10-CM | POA: Diagnosis not present

## 2020-09-01 DIAGNOSIS — E669 Obesity, unspecified: Secondary | ICD-10-CM | POA: Diagnosis not present

## 2020-09-01 DIAGNOSIS — I10 Essential (primary) hypertension: Secondary | ICD-10-CM | POA: Diagnosis not present

## 2020-10-12 ENCOUNTER — Other Ambulatory Visit: Payer: Self-pay

## 2020-10-12 ENCOUNTER — Ambulatory Visit
Admission: RE | Admit: 2020-10-12 | Discharge: 2020-10-12 | Disposition: A | Discharge status: Home / Self Care | Payer: BLUE CROSS/BLUE SHIELD | Source: Ambulatory Visit | Attending: Family Medicine | Admitting: Family Medicine

## 2020-10-12 DIAGNOSIS — Z1231 Encounter for screening mammogram for malignant neoplasm of breast: Secondary | ICD-10-CM

## 2021-03-21 DIAGNOSIS — E1165 Type 2 diabetes mellitus with hyperglycemia: Secondary | ICD-10-CM | POA: Diagnosis not present

## 2021-03-24 DIAGNOSIS — I10 Essential (primary) hypertension: Secondary | ICD-10-CM | POA: Diagnosis not present

## 2021-03-24 DIAGNOSIS — E1165 Type 2 diabetes mellitus with hyperglycemia: Secondary | ICD-10-CM | POA: Diagnosis not present

## 2021-03-24 DIAGNOSIS — R7303 Prediabetes: Secondary | ICD-10-CM | POA: Diagnosis not present

## 2021-04-26 DIAGNOSIS — E119 Type 2 diabetes mellitus without complications: Secondary | ICD-10-CM | POA: Diagnosis not present

## 2021-04-26 DIAGNOSIS — E663 Overweight: Secondary | ICD-10-CM | POA: Diagnosis not present

## 2021-04-26 DIAGNOSIS — E782 Mixed hyperlipidemia: Secondary | ICD-10-CM | POA: Diagnosis not present

## 2021-04-26 DIAGNOSIS — I10 Essential (primary) hypertension: Secondary | ICD-10-CM | POA: Diagnosis not present

## 2021-05-01 DIAGNOSIS — I10 Essential (primary) hypertension: Secondary | ICD-10-CM | POA: Diagnosis not present

## 2021-05-01 DIAGNOSIS — E559 Vitamin D deficiency, unspecified: Secondary | ICD-10-CM | POA: Diagnosis not present

## 2021-05-01 DIAGNOSIS — E1121 Type 2 diabetes mellitus with diabetic nephropathy: Secondary | ICD-10-CM | POA: Diagnosis not present

## 2021-05-01 DIAGNOSIS — E1165 Type 2 diabetes mellitus with hyperglycemia: Secondary | ICD-10-CM | POA: Diagnosis not present

## 2021-06-02 DIAGNOSIS — I959 Hypotension, unspecified: Secondary | ICD-10-CM | POA: Diagnosis not present

## 2021-06-02 DIAGNOSIS — I10 Essential (primary) hypertension: Secondary | ICD-10-CM | POA: Diagnosis not present

## 2021-06-02 DIAGNOSIS — E669 Obesity, unspecified: Secondary | ICD-10-CM | POA: Diagnosis not present

## 2021-06-02 DIAGNOSIS — E119 Type 2 diabetes mellitus without complications: Secondary | ICD-10-CM | POA: Diagnosis not present

## 2021-07-17 DIAGNOSIS — E119 Type 2 diabetes mellitus without complications: Secondary | ICD-10-CM | POA: Diagnosis not present

## 2021-08-08 DIAGNOSIS — E782 Mixed hyperlipidemia: Secondary | ICD-10-CM | POA: Diagnosis not present

## 2021-08-08 DIAGNOSIS — E559 Vitamin D deficiency, unspecified: Secondary | ICD-10-CM | POA: Diagnosis not present

## 2021-08-08 DIAGNOSIS — E663 Overweight: Secondary | ICD-10-CM | POA: Diagnosis not present

## 2021-08-08 DIAGNOSIS — E119 Type 2 diabetes mellitus without complications: Secondary | ICD-10-CM | POA: Diagnosis not present

## 2021-08-08 DIAGNOSIS — R739 Hyperglycemia, unspecified: Secondary | ICD-10-CM | POA: Diagnosis not present

## 2021-08-11 DIAGNOSIS — E559 Vitamin D deficiency, unspecified: Secondary | ICD-10-CM | POA: Diagnosis not present

## 2021-08-11 DIAGNOSIS — I10 Essential (primary) hypertension: Secondary | ICD-10-CM | POA: Diagnosis not present

## 2021-08-11 DIAGNOSIS — E1165 Type 2 diabetes mellitus with hyperglycemia: Secondary | ICD-10-CM | POA: Diagnosis not present

## 2021-08-11 DIAGNOSIS — E782 Mixed hyperlipidemia: Secondary | ICD-10-CM | POA: Diagnosis not present

## 2021-08-18 DIAGNOSIS — E1165 Type 2 diabetes mellitus with hyperglycemia: Secondary | ICD-10-CM | POA: Diagnosis not present

## 2021-08-18 DIAGNOSIS — R1013 Epigastric pain: Secondary | ICD-10-CM | POA: Diagnosis not present

## 2021-08-18 DIAGNOSIS — Z0000 Encounter for general adult medical examination without abnormal findings: Secondary | ICD-10-CM | POA: Diagnosis not present

## 2021-08-21 ENCOUNTER — Other Ambulatory Visit: Payer: Self-pay | Admitting: Nurse Practitioner

## 2021-08-21 DIAGNOSIS — R109 Unspecified abdominal pain: Secondary | ICD-10-CM

## 2021-08-22 ENCOUNTER — Other Ambulatory Visit: Payer: BC Managed Care – PPO

## 2021-08-22 ENCOUNTER — Ambulatory Visit
Admission: RE | Admit: 2021-08-22 | Discharge: 2021-08-22 | Disposition: A | Discharge status: Home / Self Care | Payer: BC Managed Care – PPO | Source: Ambulatory Visit | Attending: Nurse Practitioner | Admitting: Nurse Practitioner

## 2021-08-22 DIAGNOSIS — N281 Cyst of kidney, acquired: Secondary | ICD-10-CM | POA: Diagnosis not present

## 2021-08-22 DIAGNOSIS — R109 Unspecified abdominal pain: Secondary | ICD-10-CM | POA: Diagnosis not present

## 2021-08-23 ENCOUNTER — Other Ambulatory Visit: Payer: Self-pay | Admitting: Family Medicine

## 2021-08-23 DIAGNOSIS — Z0000 Encounter for general adult medical examination without abnormal findings: Secondary | ICD-10-CM | POA: Diagnosis not present

## 2021-08-23 DIAGNOSIS — N281 Cyst of kidney, acquired: Secondary | ICD-10-CM

## 2021-08-23 DIAGNOSIS — Z1211 Encounter for screening for malignant neoplasm of colon: Secondary | ICD-10-CM | POA: Diagnosis not present

## 2021-08-23 DIAGNOSIS — Z1159 Encounter for screening for other viral diseases: Secondary | ICD-10-CM | POA: Diagnosis not present

## 2021-08-23 DIAGNOSIS — Z2821 Immunization not carried out because of patient refusal: Secondary | ICD-10-CM | POA: Diagnosis not present

## 2021-08-24 DIAGNOSIS — N281 Cyst of kidney, acquired: Secondary | ICD-10-CM | POA: Diagnosis not present

## 2021-08-24 DIAGNOSIS — A048 Other specified bacterial intestinal infections: Secondary | ICD-10-CM | POA: Diagnosis not present

## 2021-08-24 DIAGNOSIS — K219 Gastro-esophageal reflux disease without esophagitis: Secondary | ICD-10-CM | POA: Diagnosis not present

## 2021-08-29 ENCOUNTER — Other Ambulatory Visit: Payer: BC Managed Care – PPO

## 2021-09-03 ENCOUNTER — Ambulatory Visit
Admission: RE | Admit: 2021-09-03 | Discharge: 2021-09-03 | Disposition: A | Discharge status: Home / Self Care | Payer: BC Managed Care – PPO | Source: Ambulatory Visit | Attending: Family Medicine | Admitting: Family Medicine

## 2021-09-03 DIAGNOSIS — N281 Cyst of kidney, acquired: Secondary | ICD-10-CM

## 2021-09-03 DIAGNOSIS — Q600 Renal agenesis, unilateral: Secondary | ICD-10-CM | POA: Diagnosis not present

## 2021-09-03 MED ORDER — GADOBENATE DIMEGLUMINE 529 MG/ML IV SOLN
14.0000 mL | Freq: Once | INTRAVENOUS | Status: AC | PRN
  Administered 2021-09-03: 14 mL via INTRAVENOUS

## 2022-01-17 DIAGNOSIS — R7303 Prediabetes: Secondary | ICD-10-CM | POA: Diagnosis not present

## 2022-01-17 DIAGNOSIS — I10 Essential (primary) hypertension: Secondary | ICD-10-CM | POA: Diagnosis not present

## 2022-02-09 DIAGNOSIS — I10 Essential (primary) hypertension: Secondary | ICD-10-CM | POA: Diagnosis not present

## 2022-02-09 DIAGNOSIS — E119 Type 2 diabetes mellitus without complications: Secondary | ICD-10-CM | POA: Diagnosis not present

## 2022-02-09 DIAGNOSIS — E559 Vitamin D deficiency, unspecified: Secondary | ICD-10-CM | POA: Diagnosis not present

## 2022-02-14 DIAGNOSIS — R7303 Prediabetes: Secondary | ICD-10-CM | POA: Diagnosis not present

## 2022-02-14 DIAGNOSIS — E559 Vitamin D deficiency, unspecified: Secondary | ICD-10-CM | POA: Diagnosis not present

## 2022-02-14 DIAGNOSIS — I10 Essential (primary) hypertension: Secondary | ICD-10-CM | POA: Diagnosis not present

## 2022-02-14 DIAGNOSIS — J309 Allergic rhinitis, unspecified: Secondary | ICD-10-CM | POA: Diagnosis not present

## 2022-07-13 DIAGNOSIS — E559 Vitamin D deficiency, unspecified: Secondary | ICD-10-CM | POA: Diagnosis not present

## 2022-07-13 DIAGNOSIS — E119 Type 2 diabetes mellitus without complications: Secondary | ICD-10-CM | POA: Diagnosis not present

## 2022-07-18 DIAGNOSIS — E282 Polycystic ovarian syndrome: Secondary | ICD-10-CM | POA: Diagnosis not present

## 2022-07-18 DIAGNOSIS — E119 Type 2 diabetes mellitus without complications: Secondary | ICD-10-CM | POA: Diagnosis not present

## 2022-07-18 DIAGNOSIS — I10 Essential (primary) hypertension: Secondary | ICD-10-CM | POA: Diagnosis not present

## 2022-07-18 DIAGNOSIS — E559 Vitamin D deficiency, unspecified: Secondary | ICD-10-CM | POA: Diagnosis not present

## 2022-07-24 DIAGNOSIS — E119 Type 2 diabetes mellitus without complications: Secondary | ICD-10-CM | POA: Diagnosis not present

## 2022-08-24 DIAGNOSIS — Z0000 Encounter for general adult medical examination without abnormal findings: Secondary | ICD-10-CM | POA: Diagnosis not present

## 2022-08-31 DIAGNOSIS — Z0000 Encounter for general adult medical examination without abnormal findings: Secondary | ICD-10-CM | POA: Diagnosis not present

## 2022-09-03 DIAGNOSIS — E785 Hyperlipidemia, unspecified: Secondary | ICD-10-CM | POA: Diagnosis not present

## 2022-09-03 DIAGNOSIS — R002 Palpitations: Secondary | ICD-10-CM | POA: Diagnosis not present

## 2022-09-03 DIAGNOSIS — I10 Essential (primary) hypertension: Secondary | ICD-10-CM | POA: Diagnosis not present

## 2022-09-03 DIAGNOSIS — R7309 Other abnormal glucose: Secondary | ICD-10-CM | POA: Diagnosis not present

## 2022-09-05 DIAGNOSIS — Z789 Other specified health status: Secondary | ICD-10-CM | POA: Diagnosis not present

## 2022-09-05 DIAGNOSIS — E669 Obesity, unspecified: Secondary | ICD-10-CM | POA: Diagnosis not present

## 2022-09-05 DIAGNOSIS — I10 Essential (primary) hypertension: Secondary | ICD-10-CM | POA: Diagnosis not present

## 2022-09-05 DIAGNOSIS — E782 Mixed hyperlipidemia: Secondary | ICD-10-CM | POA: Diagnosis not present

## 2023-01-14 DIAGNOSIS — E119 Type 2 diabetes mellitus without complications: Secondary | ICD-10-CM | POA: Diagnosis not present

## 2023-01-16 DIAGNOSIS — I10 Essential (primary) hypertension: Secondary | ICD-10-CM | POA: Diagnosis not present

## 2023-01-16 DIAGNOSIS — E1165 Type 2 diabetes mellitus with hyperglycemia: Secondary | ICD-10-CM | POA: Diagnosis not present

## 2023-01-16 DIAGNOSIS — E66811 Obesity, class 1: Secondary | ICD-10-CM | POA: Diagnosis not present

## 2023-04-19 DIAGNOSIS — Z789 Other specified health status: Secondary | ICD-10-CM | POA: Diagnosis not present

## 2023-04-19 DIAGNOSIS — E1121 Type 2 diabetes mellitus with diabetic nephropathy: Secondary | ICD-10-CM | POA: Diagnosis not present

## 2023-04-19 DIAGNOSIS — E782 Mixed hyperlipidemia: Secondary | ICD-10-CM | POA: Diagnosis not present

## 2023-04-19 DIAGNOSIS — E1165 Type 2 diabetes mellitus with hyperglycemia: Secondary | ICD-10-CM | POA: Diagnosis not present

## 2023-04-29 IMAGING — US US ABDOMEN COMPLETE
1 series · 13 of 25 positions shown · non-contrast
Comparison: CT chest 12/20/2016.  CT abdomen report 05/04/2013.
COMPARISON: CT chest 12/20/2016.  CT abdomen report 05/04/2013.

Addendum:
CLINICAL DATA: Abdominal pain. Concern for gallbladder dysfunction.

EXAM:
ABDOMEN ULTRASOUND COMPLETE

[Series 1: us abdomen complete · 0.22mm/px · 13 of 79 slices shown]
[im 1/79]
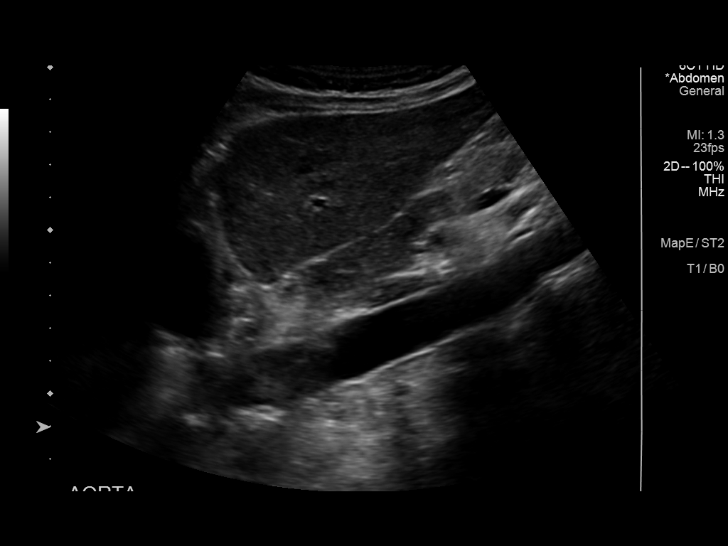
[im 7/79]
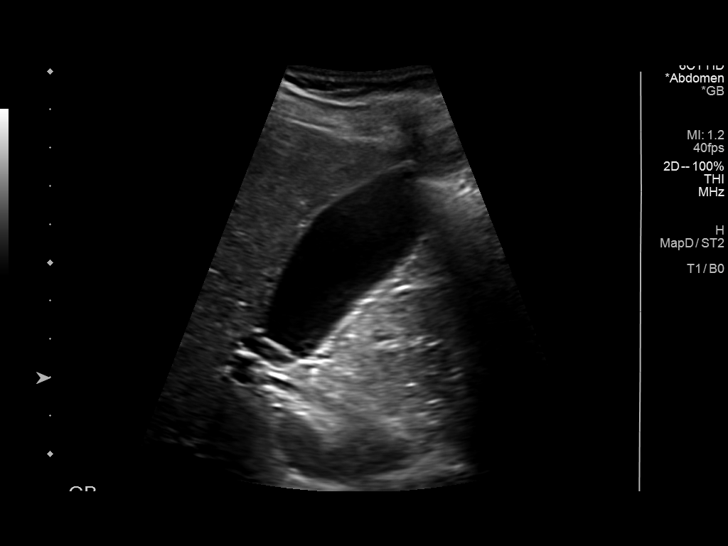
[im 14/79]
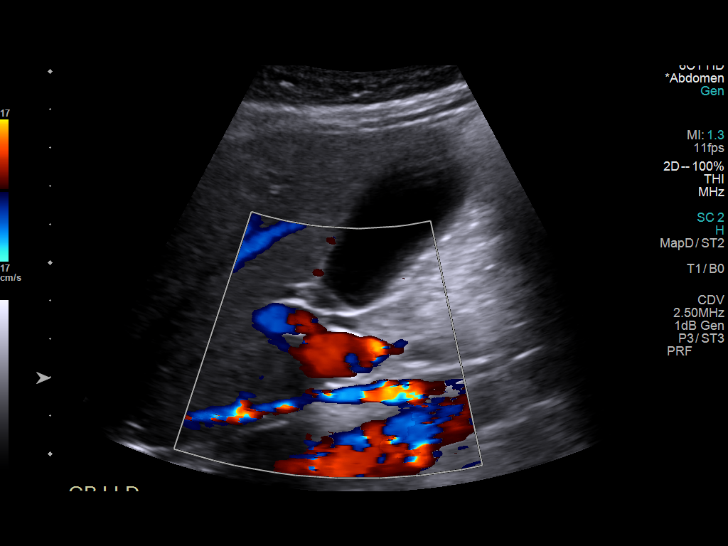
[im 20/79]
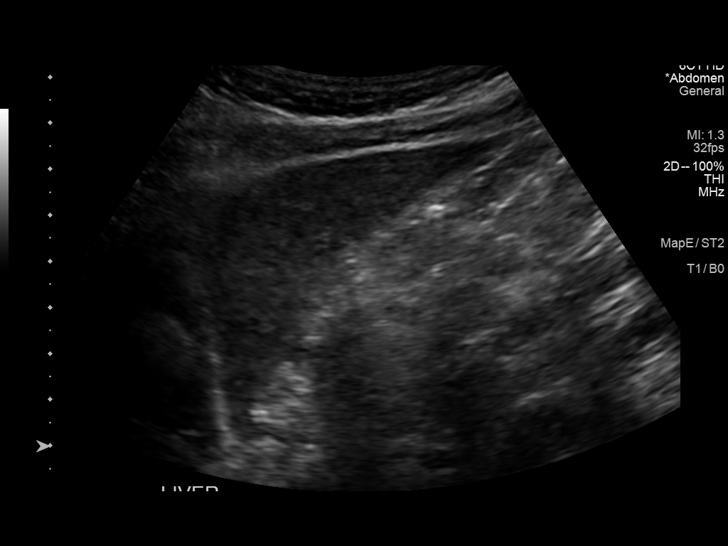
[im 27/79]
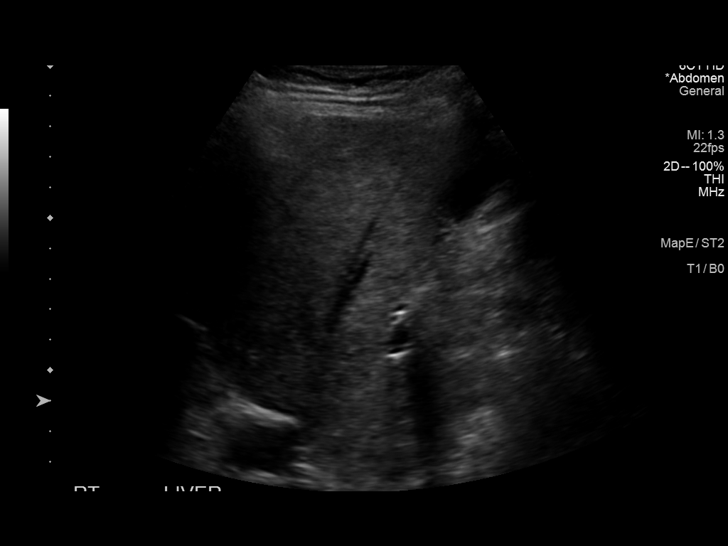
[im 33/79]
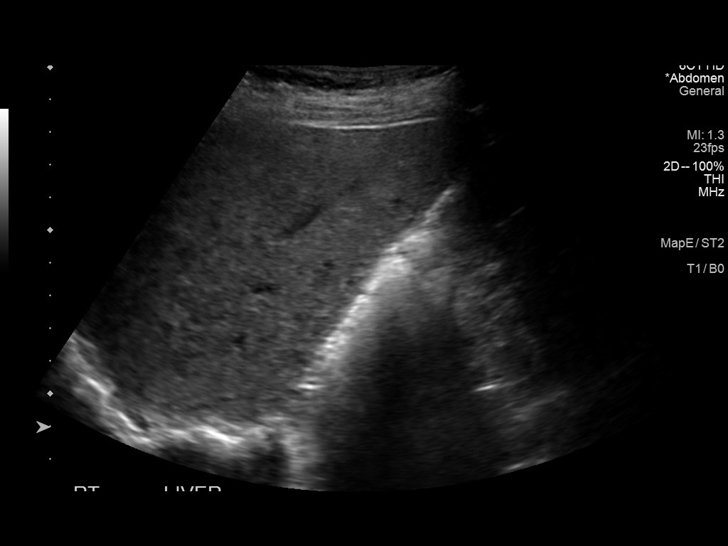
[im 40/79]
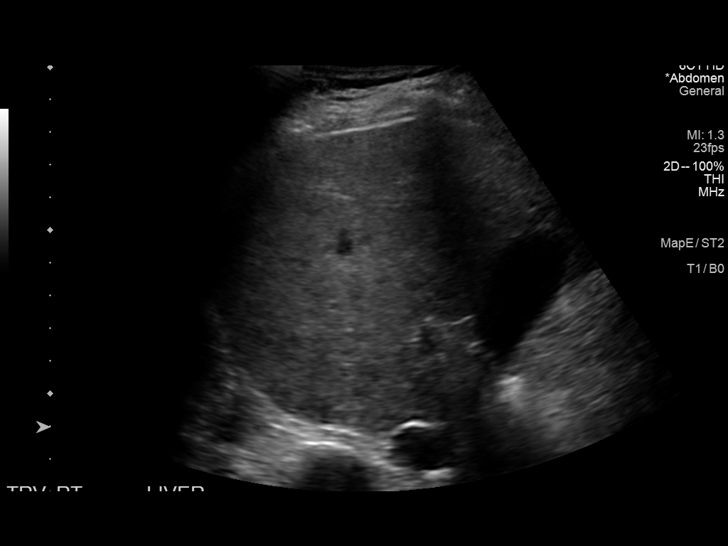
[im 46/79]
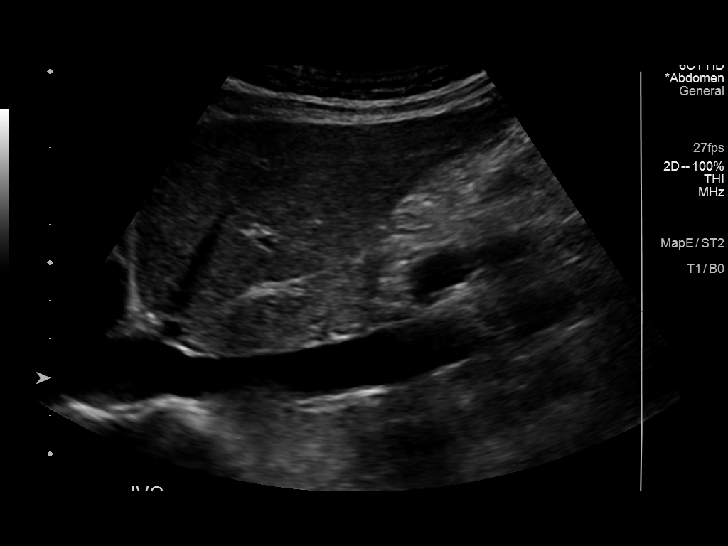
[im 53/79]
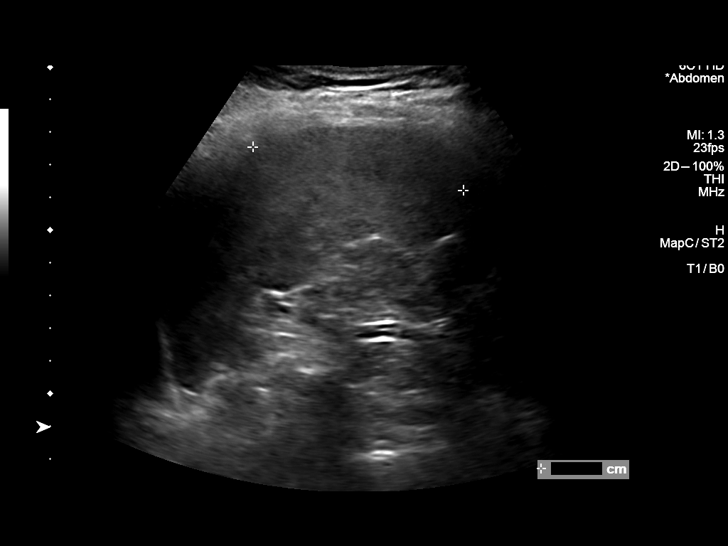
[im 59/79]
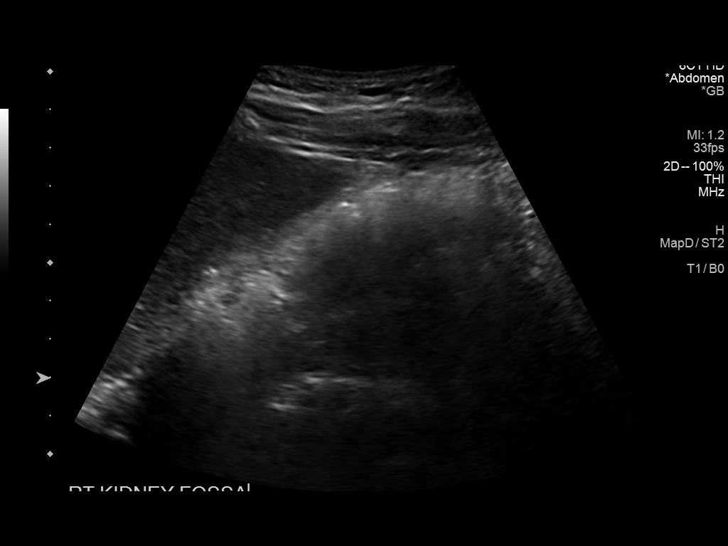
[im 66/79]
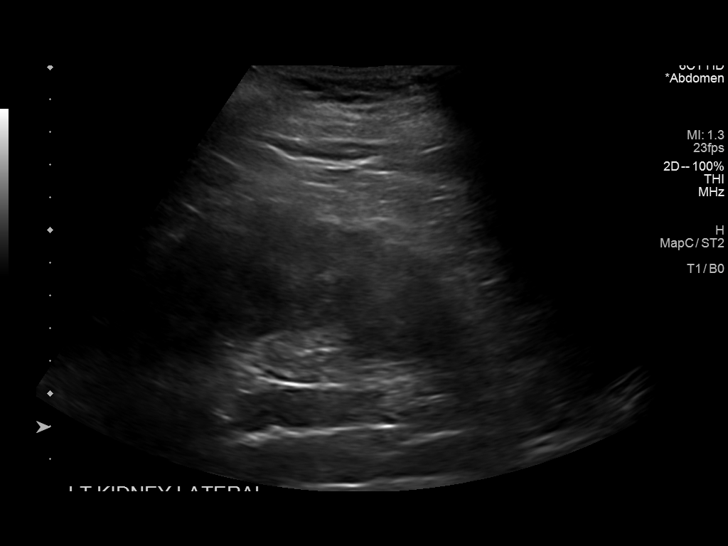
[im 72/79]
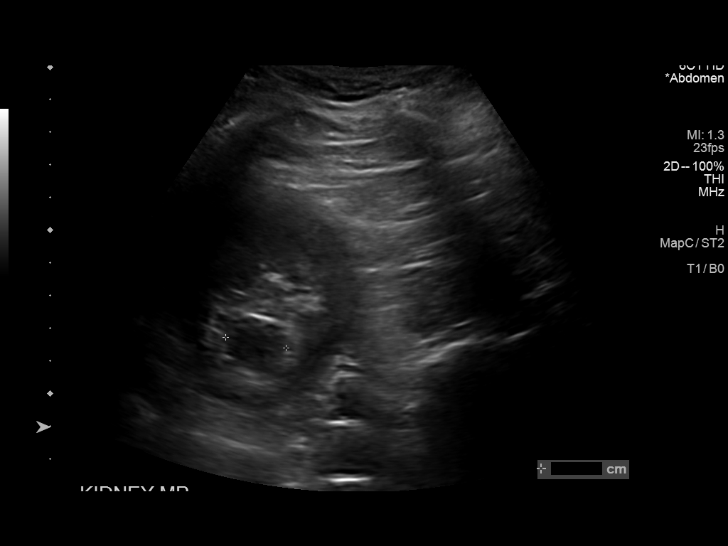
[im 79/79]
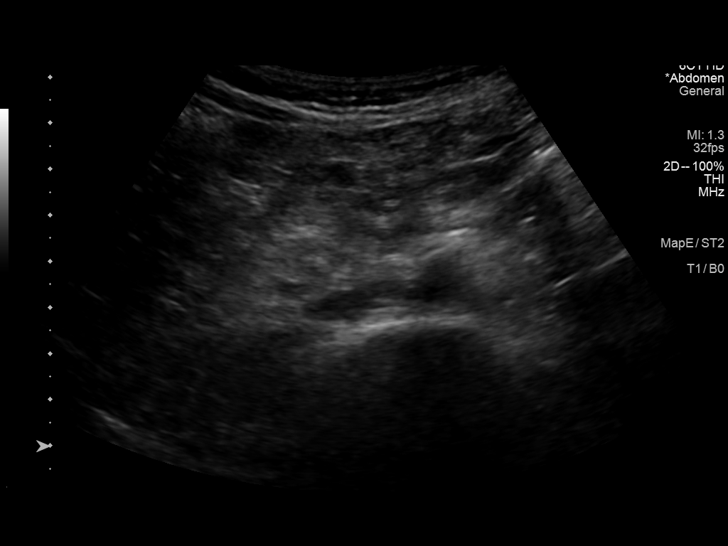

[13 of 25 positions shown; findings below may reference images not displayed]

FINDINGS: Gallbladder: No gallstones or wall thickening visualized. No
sonographic Murphy sign noted by sonographer.

Common bile duct: Diameter: 3.4 mm

Liver: No focal lesion identified. Within normal limits in
parenchymal echogenicity. Portal vein is patent on color Doppler
imaging with normal direction of blood flow towards the liver.

IVC: No abnormality visualized.

Pancreas: Visualized portion unremarkable.

Spleen: Size and appearance within normal limits.

Right Kidney: Length: Prior right nephrectomy.

Left Kidney: Length: 11.6 cm. Echogenicity within normal limits.
x 2.3 x 1.9 cm complex left renal cyst. Renal MRI suggested for
further evaluation. 3.5 mm nonobstructing left renal stone noted.

Abdominal aorta: No aneurysm visualized.

Other findings: None.
IMPRESSION: 1. 1.5 x 2.3 x 1.9 cm complex left renal cyst. Renal MRI suggested
for further evaluation. Also noted is a 3.5 mm nonobstructing left
renal stone.

2.  Prior right nephrectomy.

3. Exam otherwise unremarkable. No gallstones or biliary distention.

ADDENDUM:
These results will be called to the ordering clinician or
representative by the Radiologist Assistant, and communication
documented in the PACS or [REDACTED].

*** End of Addendum ***
FINDINGS: Gallbladder: No gallstones or wall thickening visualized. No
sonographic Murphy sign noted by sonographer.

Common bile duct: Diameter: 3.4 mm

Liver: No focal lesion identified. Within normal limits in
parenchymal echogenicity. Portal vein is patent on color Doppler
imaging with normal direction of blood flow towards the liver.

IVC: No abnormality visualized.

Pancreas: Visualized portion unremarkable.

Spleen: Size and appearance within normal limits.

Right Kidney: Length: Prior right nephrectomy.

Left Kidney: Length: 11.6 cm. Echogenicity within normal limits.
x 2.3 x 1.9 cm complex left renal cyst. Renal MRI suggested for
further evaluation. 3.5 mm nonobstructing left renal stone noted.

Abdominal aorta: No aneurysm visualized.

Other findings: None.
IMPRESSION: 1. 1.5 x 2.3 x 1.9 cm complex left renal cyst. Renal MRI suggested
for further evaluation. Also noted is a 3.5 mm nonobstructing left
renal stone.

2.  Prior right nephrectomy.

3. Exam otherwise unremarkable. No gallstones or biliary distention.

## 2023-09-03 ENCOUNTER — Other Ambulatory Visit: Payer: Self-pay | Admitting: Physician Assistant

## 2023-09-03 DIAGNOSIS — Z1231 Encounter for screening mammogram for malignant neoplasm of breast: Secondary | ICD-10-CM

## 2023-09-20 ENCOUNTER — Encounter: Payer: Self-pay | Admitting: Advanced Practice Midwife
